# Patient Record
Sex: Female | Born: 2005 | Race: Black or African American | Hispanic: No | Marital: Single | Smoking: Never smoker
Health system: Southern US, Community
[De-identification: ages and names within clinical notes are randomized; demographics above are authoritative.]

## PROBLEM LIST (undated history)

## (undated) DIAGNOSIS — J45909 Unspecified asthma, uncomplicated: Secondary | ICD-10-CM

## (undated) HISTORY — PX: DENTAL SURGERY: SHX609

## (undated) HISTORY — PX: KNEE SURGERY: SHX244

---
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
## 2023-02-27 ENCOUNTER — Other Ambulatory Visit: Payer: Self-pay

## 2023-02-27 ENCOUNTER — Emergency Department (HOSPITAL_COMMUNITY)
Admission: EM | Admit: 2023-02-27 | Discharge: 2023-02-27 | Disposition: A | Discharge status: Home / Self Care | Payer: BC Managed Care – PPO | Attending: Emergency Medicine | Admitting: Emergency Medicine

## 2023-02-27 ENCOUNTER — Emergency Department (HOSPITAL_COMMUNITY): Payer: BC Managed Care – PPO

## 2023-02-27 ENCOUNTER — Encounter (HOSPITAL_COMMUNITY): Payer: Self-pay

## 2023-02-27 DIAGNOSIS — R0789 Other chest pain: Secondary | ICD-10-CM | POA: Diagnosis present

## 2023-02-27 DIAGNOSIS — M545 Low back pain, unspecified: Secondary | ICD-10-CM | POA: Diagnosis not present

## 2023-02-27 DIAGNOSIS — S20212A Contusion of left front wall of thorax, initial encounter: Secondary | ICD-10-CM | POA: Insufficient documentation

## 2023-02-27 DIAGNOSIS — Y92410 Unspecified street and highway as the place of occurrence of the external cause: Secondary | ICD-10-CM | POA: Insufficient documentation

## 2023-02-27 HISTORY — DX: Unspecified asthma, uncomplicated: J45.909

## 2023-02-27 MED ORDER — NAPROXEN 375 MG PO TABS: 375.0000 mg | ORAL_TABLET | Freq: Two times a day (BID) | ORAL | 0 refills | Status: AC
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
## 2023-02-27 NOTE — Discharge Instructions (Signed)
 Return to the emergency department immediately if you develop any of the following symptoms: You have numbness, tingling, or weakness in the arms or legs. You develop severe headaches not relieved with medicine. You have severe neck pain, especially tenderness in the middle of the back of your neck. You have changes in bowel or bladder control. There is increasing pain in any area of the body. You have shortness of breath, light-headedness, dizziness, or fainting. You have chest pain. You feel sick to your stomach (nauseous), throw up (vomit), or sweat. You have increasing abdominal discomfort. There is blood in your urine, stool, or vomit. You have pain in your shoulder (shoulder strap areas). You feel your symptoms are getting worse.
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
## 2024-01-31 ENCOUNTER — Other Ambulatory Visit: Payer: Self-pay | Admitting: Nurse Practitioner

## 2024-01-31 DIAGNOSIS — N6311 Unspecified lump in the right breast, upper outer quadrant: Secondary | ICD-10-CM

## 2024-02-28 ENCOUNTER — Ambulatory Visit
Admission: RE | Admit: 2024-02-28 | Discharge: 2024-02-28 | Disposition: A | Discharge status: Home / Self Care | Source: Ambulatory Visit | Attending: Nurse Practitioner | Admitting: Nurse Practitioner

## 2024-02-28 DIAGNOSIS — N6311 Unspecified lump in the right breast, upper outer quadrant: Secondary | ICD-10-CM
